# Patient Record
Sex: Male | Born: 1940 | Race: White | Hispanic: No | Smoking: Never smoker
Health system: Southern US, Community
[De-identification: ages and names within clinical notes are randomized; demographics above are authoritative.]

## PROBLEM LIST (undated history)

## (undated) DIAGNOSIS — C61 Malignant neoplasm of prostate: Secondary | ICD-10-CM

## (undated) DIAGNOSIS — K5900 Constipation, unspecified: Secondary | ICD-10-CM

## (undated) HISTORY — PX: PROSTATE SURGERY: SHX751

---

## 2014-03-16 ENCOUNTER — Encounter (HOSPITAL_BASED_OUTPATIENT_CLINIC_OR_DEPARTMENT_OTHER): Payer: Self-pay | Admitting: Emergency Medicine

## 2014-03-16 ENCOUNTER — Emergency Department (HOSPITAL_BASED_OUTPATIENT_CLINIC_OR_DEPARTMENT_OTHER): Payer: Medicare Other

## 2014-03-16 ENCOUNTER — Emergency Department (HOSPITAL_BASED_OUTPATIENT_CLINIC_OR_DEPARTMENT_OTHER)
Admission: EM | Admit: 2014-03-16 | Discharge: 2014-03-16 | Disposition: A | Discharge status: Home / Self Care | Payer: Medicare Other | Attending: Emergency Medicine | Admitting: Emergency Medicine

## 2014-03-16 DIAGNOSIS — Z8546 Personal history of malignant neoplasm of prostate: Secondary | ICD-10-CM | POA: Insufficient documentation

## 2014-03-16 DIAGNOSIS — F411 Generalized anxiety disorder: Secondary | ICD-10-CM | POA: Diagnosis not present

## 2014-03-16 DIAGNOSIS — Z0000 Encounter for general adult medical examination without abnormal findings: Secondary | ICD-10-CM

## 2014-03-16 DIAGNOSIS — Z711 Person with feared health complaint in whom no diagnosis is made: Secondary | ICD-10-CM | POA: Insufficient documentation

## 2014-03-16 DIAGNOSIS — K5900 Constipation, unspecified: Secondary | ICD-10-CM | POA: Diagnosis present

## 2014-03-16 HISTORY — DX: Malignant neoplasm of prostate: C61

## 2014-03-16 HISTORY — DX: Constipation, unspecified: K59.00

## 2014-03-16 NOTE — ED Provider Notes (Signed)
CSN: [id number]     Arrival date & time 03/16/14  0240 History   First MD Initiated Contact with Patient 03/16/14 0533     Chief Complaint  Patient presents with  . Constipation     (Consider location/radiation/quality/duration/timing/severity/associated sxs/prior Treatment) Patient is a 73 y.o. male presenting with constipation. The history is provided by the patient.  Constipation Severity:  Moderate Time since last bowel movement:  3 weeks Timing:  Intermittent Progression:  Unchanged Chronicity:  Recurrent Context: not dehydration   Stool description:  Hard Relieved by:  Enemas Worsened by:  Nothing tried Ineffective treatments:  Stool softeners Associated symptoms: no abdominal pain, no back pain, no diarrhea, no nausea, no urinary retention and no vomiting   Risk factors: no change in medication and no recent surgery   Patient reports a h/o constipation recently saw PMD on 9/3 for constipation and was told to stay off dairy and to start fiber supplements has taken them once of twice and has taken miralax 3 days since symptoms started took an enema which produced a large stool and has been having small hard stool in the mean time but does not feel this is enough so he is intentionally not eating even though he is hungry.  Wants a referral back to his GI specialist who is [facility].  Last had colonoscopy 2 years ago.  Past Medical History  Diagnosis Date  . Constipation   . Prostate cancer    Past Surgical History  Procedure Laterality Date  . Prostate surgery     No family history on file. History  Substance Use Topics  . Smoking status: Never Smoker   . Smokeless tobacco: Not on file  . Alcohol Use: No    Review of Systems  Gastrointestinal: Positive for constipation. Negative for nausea, vomiting, abdominal pain and diarrhea.  Musculoskeletal: Negative for back pain.  All other systems reviewed and are negative.     Allergies  Review of patient's  allergies indicates no known allergies.  Home Medications   Prior to Admission medications   Not on File   BP 93/61  Pulse 81  Temp(Src) 98.4 F (36.9 C) (Oral)  Resp 18  Ht 5\' 8"  (1.727 m)  Wt 133 lb (60.328 kg)  BMI 20.23 kg/m2  SpO2 98% Physical Exam  Constitutional: He is oriented to person, place, and time. He appears well-developed and well-nourished. No distress.  HENT:  Head: Normocephalic and atraumatic.  Mouth/Throat: Oropharynx is clear and moist. No oropharyngeal exudate.  Eyes: EOM are normal. Pupils are equal, round, and reactive to light.  Neck: Normal range of motion. Neck supple.  Cardiovascular: Normal rate, regular rhythm and intact distal pulses.   Pulmonary/Chest: Effort normal and breath sounds normal. He has no wheezes. He has no rales.  Abdominal: Soft. He exhibits no distension and no mass. Bowel sounds are increased. There is no tenderness. There is no rebound and no guarding.  Musculoskeletal: Normal range of motion. He exhibits no edema.  Neurological: He is alert and oriented to person, place, and time.  Skin: Skin is warm and dry. He is not diaphoretic.  Psychiatric: Thought content normal.  Slightly anxious    ED Course  Procedures (including critical care time) Labs Review Labs Reviewed - No data to display  Imaging Review No results found.   EKG Interpretation None      MDM   Final diagnoses:  None    Patient is restricting food intake because he is afraid he  is constipated.  He states he is hungry and wants to eat. I suspect the patient is also still having a grief reaction from the loss of his wife and has not been eating well secondary to same.  He is not constipated clinically nor on films.  EDP had a lengthy discussion with the patient that it is the gastrocolic reflex.  Patient need food in the stomach to cause the colon to contract.  EDP gave the patient a high protein diet to follow with ensure supplements as snacks.  EDP  instructed patient to add greek yogurt to smoothies in order to increased probiotics and protein.  EDp also instructed patient to get into a bathroom routine so he sets aside time to use the bathroom and this will become a good habit.  Sheets on smoothies and high protein diets given to patient.  Patient may also take miralax daily, but it needs to be take for several days consecutively for effect. Follow up at your family doctor for ongoing care.      Carlisle Beers, MD 03/16/14 [phone]

## 2014-03-16 NOTE — ED Notes (Signed)
Patient transported to X-ray ambulatory with tech. 

## 2014-03-16 NOTE — ED Notes (Signed)
MD at bedside. 

## 2014-03-16 NOTE — ED Notes (Signed)
Pt reports several weeks of constipation.  Last normal bm 2 days ago after enema.  Reports abd discomfort, denies n/v.

## 2015-09-11 ENCOUNTER — Emergency Department (HOSPITAL_BASED_OUTPATIENT_CLINIC_OR_DEPARTMENT_OTHER)
Admission: EM | Admit: 2015-09-11 | Discharge: 2015-09-11 | Disposition: A | Discharge status: Home / Self Care | Payer: Medicare Other | Attending: Emergency Medicine | Admitting: Emergency Medicine

## 2015-09-11 ENCOUNTER — Encounter (HOSPITAL_BASED_OUTPATIENT_CLINIC_OR_DEPARTMENT_OTHER): Payer: Self-pay | Admitting: *Deleted

## 2015-09-11 DIAGNOSIS — L03011 Cellulitis of right finger: Secondary | ICD-10-CM | POA: Diagnosis not present

## 2015-09-11 DIAGNOSIS — Z8546 Personal history of malignant neoplasm of prostate: Secondary | ICD-10-CM | POA: Diagnosis not present

## 2015-09-11 DIAGNOSIS — Z8719 Personal history of other diseases of the digestive system: Secondary | ICD-10-CM | POA: Diagnosis not present

## 2015-09-11 DIAGNOSIS — M79644 Pain in right finger(s): Secondary | ICD-10-CM | POA: Diagnosis present

## 2015-09-11 DIAGNOSIS — IMO0002 Reserved for concepts with insufficient information to code with codable children: Secondary | ICD-10-CM

## 2015-09-11 MED ORDER — SULFAMETHOXAZOLE-TRIMETHOPRIM 800-160 MG PO TABS: 1.0000 | ORAL_TABLET | Freq: Two times a day (BID) | ORAL | Status: AC

## 2015-09-11 MED ORDER — CEPHALEXIN 500 MG PO CAPS: 500.0000 mg | ORAL_CAPSULE | Freq: Two times a day (BID) | ORAL | Status: AC

## 2015-09-11 MED ORDER — LIDOCAINE HCL (PF) 1 % IJ SOLN
INTRAMUSCULAR | Status: AC
  Administered 2015-09-11: 5 mL
  Filled 2015-09-11: qty 5

## 2015-09-11 MED ORDER — LIDOCAINE HCL (PF) 1 % IJ SOLN
5.0000 mL | Freq: Once | INTRAMUSCULAR | Status: AC
  Administered 2015-09-11: 5 mL

## 2015-09-11 NOTE — ED Provider Notes (Signed)
I saw and evaluated the patient, reviewed the resident's note and I agree with the findings and plan. If applicable, I agree with the resident's interpretation of the EKG.  If applicable, I was present for critical portions of any procedures performed.   2 days of pain and swelling to right great thumb. Denies injury. No vomiting or fever.  Large paronychia involving cuticle surface of right thumb with erythema and edema. Intact range of motion and radial pulse. No drainage.  Incise and drain paronychia at bedside. Wound care, antibiotics.  Ezequiel Essex, MD 09/11/15 [phone]

## 2015-09-11 NOTE — Discharge Instructions (Signed)
You were diagnosed with a Paronychia which is an infection of the nail bed You were prescribed antibiotics, Keflex and Bactrim to take twice per day for 5 days Please follow with your primary are provider by the end of the week If you have worsening thumb pain, difficulty moving it, fevers, chills go to the emergency room immediately for evaluation

## 2015-09-11 NOTE — ED Notes (Signed)
Pt reports pain and swelling to his left thumb. Denies any fever or any other c/o. md at bedside for eval.

## 2015-09-11 NOTE — ED Notes (Signed)
Incision & Drainage tray at bedside.

## 2015-09-11 NOTE — ED Notes (Signed)
Triage deferred, md x 2 at bedside. Supplies gathered and placed at bedside for md.

## 2015-09-11 NOTE — ED Provider Notes (Signed)
CSN: [id number]     Arrival date & time 09/11/15  0710 History   First MD Initiated Contact with Patient 09/11/15 [phone]     Chief Complaint  Patient presents with  . Hand Pain    HPI  75 male presenting for right thumb swelling and pain. Patient first started redness and pain of his thumb below his thumbnail on Friday 09/08/2015. This started to swell yesterday and this morning was noted to be very swollen and tender to palpation. He denies fevers, nausea, vomiting, diarrhea. He denies weakness of the hand or thumb. Otherwise denies shortness of breath, chest pain, headache, changes in vision, new weakness.    Past Medical History  Diagnosis Date  . Constipation   . Prostate cancer [facility])    Past Surgical History  Procedure Laterality Date  . Prostate surgery     History reviewed. No pertinent family history. Social History  Substance Use Topics  . Smoking status: Never Smoker   . Smokeless tobacco: None  . Alcohol Use: No    Review of Systems  Constitutional: Negative.   HENT: Negative.   Eyes: Negative.   Respiratory: Negative.   Cardiovascular: Negative.   Gastrointestinal: Negative.   Genitourinary: Negative.   Musculoskeletal: Negative.   Skin: Negative.   Neurological: Negative.       Allergies  Review of patient's allergies indicates no known allergies.  Home Medications   Prior to Admission medications   Medication Sig Start Date End Date Taking? Authorizing Provider  cephALEXin (KEFLEX) 500 MG capsule Take 1 capsule (500 mg total) by mouth 2 (two) times daily. 09/11/15   Veatrice Bourbon, MD  sulfamethoxazole-trimethoprim (BACTRIM DS,SEPTRA DS) 800-160 MG tablet Take 1 tablet by mouth 2 (two) times daily. 09/11/15   Kaveri Perras A Angelica Wix, MD   BP 118/69 mmHg  Pulse 72  Temp(Src) 98.4 F (36.9 C) (Oral)  Resp 18  Ht 5\' 8"  (1.727 m)  Wt 68.04 kg  BMI 22.81 kg/m2  SpO2 100% Physical Exam  Constitutional: He is oriented to person, place, and time. He appears  well-developed and well-nourished.  HENT:  Head: Normocephalic and atraumatic.  Eyes: EOM are normal. Pupils are equal, round, and reactive to light.  Neck: Normal range of motion.  Cardiovascular: Normal rate, regular rhythm and normal heart sounds.   No murmur heard. Pulmonary/Chest: Effort normal and breath sounds normal. No respiratory distress.  Abdominal: Soft. Bowel sounds are normal. He exhibits no distension.  Musculoskeletal: Normal range of motion.  Neurological: He is alert and oriented to person, place, and time.  Skin: Skin is warm and dry.  Extremities: Swelling of right thumb cuticle with erythematous and pustular appearance of swelling, + radial pulse, no streaking of the erythema  ED Course  Procedures (including critical care time) Labs Review Labs Reviewed - No data to display  Imaging Review No results found. I have personally reviewed and evaluated these images and lab results as part of my medical decision-making.   EKG Interpretation None      I&D After inspection and confirmation of correct site, the right thumb was cleaned with alcohol swabs 3 prior to forming digital block. The thumb was then cleaned with Betadine swabs 3. After confirmation of adequate anesthesia 11 blade was used to incise an approximate 1 cm incision into the abscess. Purulent fluid was expressed until none was remaining. Pressure was then held until hemostasis is achieved. The thumb was cleaned and wrapped. The patient tolerated the procedure well.  MDM   Final diagnoses:  Paronychia, unspecified laterality   75 year old male presenting with left thumb paronychia. This was drained in the emergency room. Patient tolerated this procedure well and was discharged with Keflex and to follow-up with his primary care provider within the next few days. Return precautions were discussed with the patient.  Yorel Redder A. Lincoln Brigham MD, [facility] Resident PGY-2 Pager  [phone]     Veatrice Bourbon, MD 09/11/15 [place], MD 09/11/15 [phone]

## 2015-09-28 IMAGING — CR DG ABDOMEN ACUTE W/ 1V CHEST
3 series · 3 of 3 positions shown · non-contrast
Comparison: None.

CLINICAL DATA: Constipation for several weeks. Abdominal
discomfort. History of prostate cancer.

EXAM:
ACUTE ABDOMEN SERIES (ABDOMEN 2 VIEW & CHEST 1 VIEW)

[w chest pa]
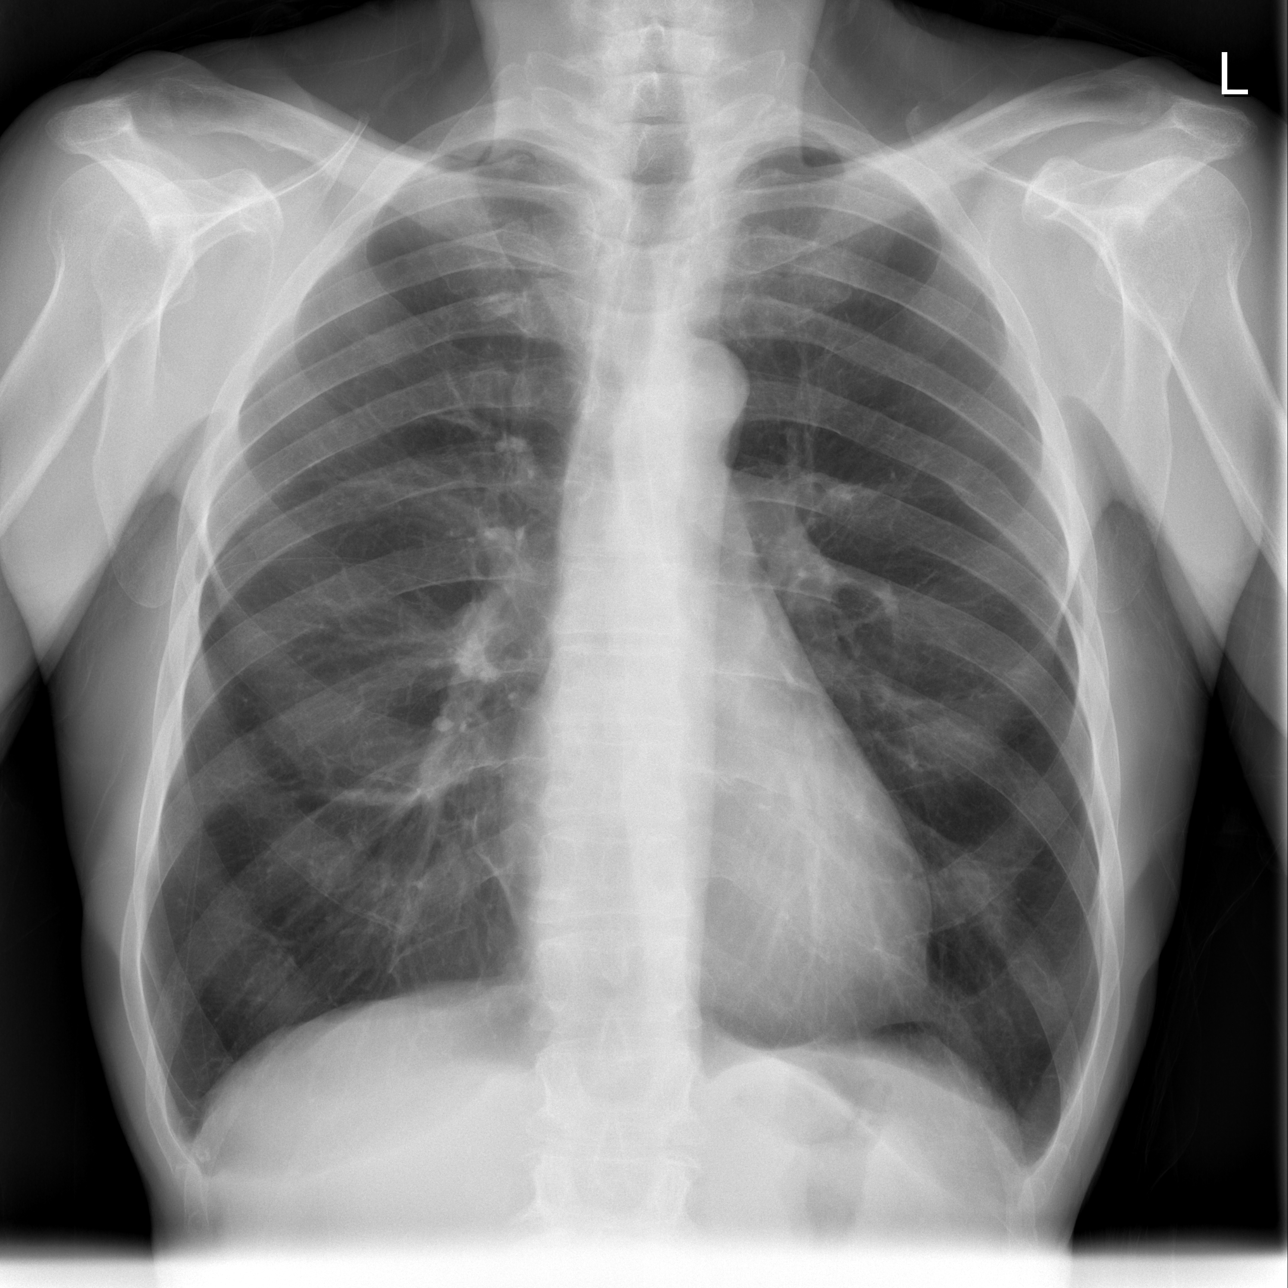

[w abdomen upright]
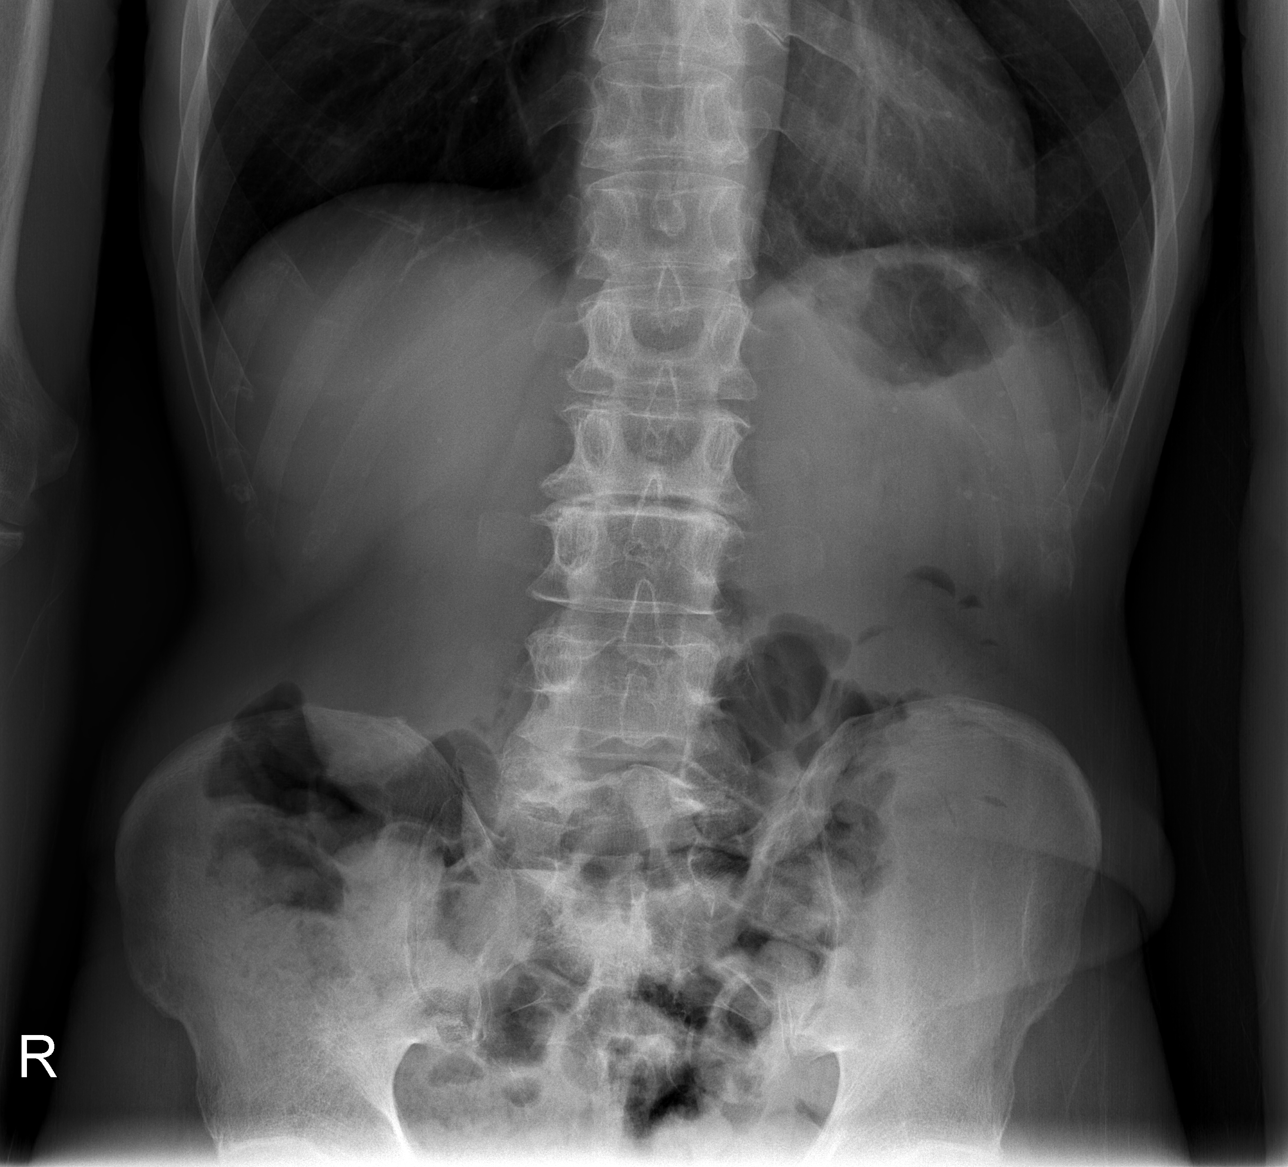

[t abdomen supine]
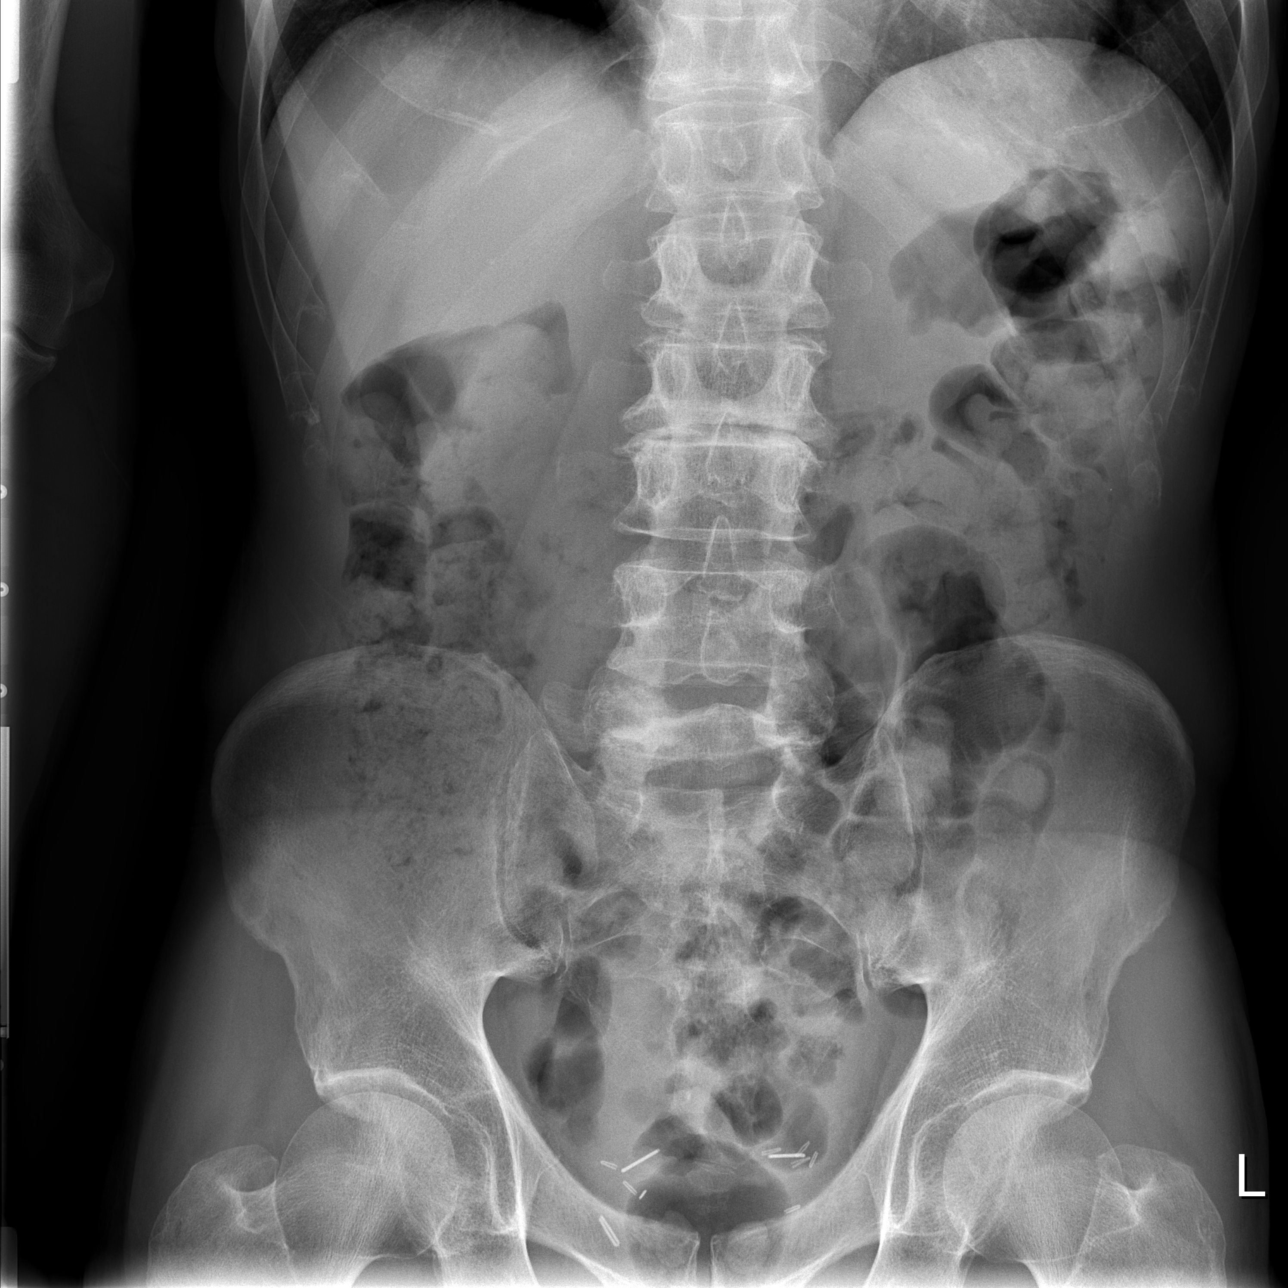

[3 of 3 positions shown; findings below may reference images not displayed]

FINDINGS: Pulmonary hyperinflation likely due to emphysema. Heart size and
pulmonary vascularity are normal. No focal airspace disease or
consolidation in the lungs. No blunting of costophrenic angles. No
pneumothorax. Old left rib fractures.

Scattered gas and stool in the colon and small bowel. No small or
large bowel distention. No free intra-abdominal air. No abnormal
air-fluid levels. Calcifications in the upper abdomen consistent
with granulomas in the liver and spleen. No radiopaque stones.
Surgical clips in the pelvis. Degenerative changes in the lumbar
spine.
IMPRESSION: Emphysematous changes in the chest. No evidence of active pulmonary
disease. Nonobstructive bowel gas pattern.

## 2017-11-05 DEATH — deceased
